# Patient Record
Sex: Female | Born: 1975 | Race: White | Hispanic: No | Marital: Single | Smoking: Current every day smoker
Health system: Southern US, Community
[De-identification: ages and names within clinical notes are randomized; demographics above are authoritative.]

## PROBLEM LIST (undated history)

## (undated) DIAGNOSIS — F329 Major depressive disorder, single episode, unspecified: Secondary | ICD-10-CM

## (undated) DIAGNOSIS — F419 Anxiety disorder, unspecified: Secondary | ICD-10-CM

## (undated) DIAGNOSIS — N6019 Diffuse cystic mastopathy of unspecified breast: Secondary | ICD-10-CM

## (undated) DIAGNOSIS — N809 Endometriosis, unspecified: Secondary | ICD-10-CM

## (undated) DIAGNOSIS — N83202 Unspecified ovarian cyst, left side: Secondary | ICD-10-CM

## (undated) DIAGNOSIS — C539 Malignant neoplasm of cervix uteri, unspecified: Secondary | ICD-10-CM

## (undated) DIAGNOSIS — F32A Depression, unspecified: Secondary | ICD-10-CM

## (undated) DIAGNOSIS — F4310 Post-traumatic stress disorder, unspecified: Secondary | ICD-10-CM

## (undated) DIAGNOSIS — M5136 Other intervertebral disc degeneration, lumbar region: Secondary | ICD-10-CM

## (undated) DIAGNOSIS — N83201 Unspecified ovarian cyst, right side: Secondary | ICD-10-CM

## (undated) HISTORY — PX: HERNIA REPAIR: SHX51

## (undated) HISTORY — DX: Other intervertebral disc degeneration, lumbar region: M51.36

## (undated) HISTORY — DX: Unspecified ovarian cyst, right side: N83.201

## (undated) HISTORY — DX: Anxiety disorder, unspecified: F41.9

## (undated) HISTORY — DX: Depression, unspecified: F32.A

## (undated) HISTORY — PX: KNEE ARTHROSCOPY: SUR90

## (undated) HISTORY — PX: ECTOPIC PREGNANCY SURGERY: SHX613

## (undated) HISTORY — PX: ABDOMINAL HYSTERECTOMY: SHX81

## (undated) HISTORY — DX: Endometriosis, unspecified: N80.9

## (undated) HISTORY — DX: Post-traumatic stress disorder, unspecified: F43.10

## (undated) HISTORY — DX: Malignant neoplasm of cervix uteri, unspecified: C53.9

## (undated) HISTORY — DX: Major depressive disorder, single episode, unspecified: F32.9

## (undated) HISTORY — DX: Diffuse cystic mastopathy of unspecified breast: N60.19

## (undated) HISTORY — DX: Unspecified ovarian cyst, right side: N83.202

---

## 2014-03-08 ENCOUNTER — Emergency Department: Payer: Self-pay | Admitting: Emergency Medicine

## 2014-03-08 LAB — COMPREHENSIVE METABOLIC PANEL
ALT: 28 U/L
Albumin: 3.2 g/dL — ABNORMAL LOW (ref 3.4–5.0)
Alkaline Phosphatase: 58 U/L
Anion Gap: 8 (ref 7–16)
BILIRUBIN TOTAL: 0.3 mg/dL (ref 0.2–1.0)
BUN: 15 mg/dL (ref 7–18)
CALCIUM: 7.9 mg/dL — AB (ref 8.5–10.1)
CHLORIDE: 104 mmol/L (ref 98–107)
CO2: 27 mmol/L (ref 21–32)
Creatinine: 1.05 mg/dL (ref 0.60–1.30)
EGFR (Non-African Amer.): >60
Glucose: 76 mg/dL (ref 65–99)
Osmolality: 277 (ref 275–301)
Potassium: 3.9 mmol/L (ref 3.5–5.1)
SGOT(AST): 21 U/L (ref 15–37)
SODIUM: 139 mmol/L (ref 136–145)
TOTAL PROTEIN: 6.6 g/dL (ref 6.4–8.2)

## 2014-03-08 LAB — URINALYSIS, COMPLETE
BILIRUBIN, UR: NEGATIVE
Bacteria: NONE SEEN
Glucose,UR: NEGATIVE mg/dL (ref 0–75)
Ketone: NEGATIVE
LEUKOCYTE ESTERASE: NEGATIVE
NITRITE: NEGATIVE
PROTEIN: NEGATIVE
Ph: 6 (ref 4.5–8.0)
RBC,UR: 1 /HPF (ref 0–5)
SPECIFIC GRAVITY: 1.011 (ref 1.003–1.030)
Squamous Epithelial: 1
WBC UR: 1 /HPF (ref 0–5)

## 2014-03-08 LAB — CBC
HCT: 36.9 % (ref 35.0–47.0)
HGB: 12.3 g/dL (ref 12.0–16.0)
MCH: 30.1 pg (ref 26.0–34.0)
MCHC: 33.3 g/dL (ref 32.0–36.0)
MCV: 91 fL (ref 80–100)
Platelet: 369 10*3/uL (ref 150–440)
RBC: 4.08 10*6/uL (ref 3.80–5.20)
RDW: 15.3 % — ABNORMAL HIGH (ref 11.5–14.5)
WBC: 8.6 10*3/uL (ref 3.6–11.0)

## 2014-03-08 LAB — PREGNANCY, URINE: Pregnancy Test, Urine: NEGATIVE m[IU]/mL

## 2014-03-08 LAB — PRO B NATRIURETIC PEPTIDE: B-TYPE NATIURETIC PEPTID: 142 pg/mL — AB (ref 0–125)

## 2014-03-09 LAB — TROPONIN I: Troponin-I: <0.02 ng/mL

## 2014-08-23 ENCOUNTER — Observation Stay: Payer: Self-pay | Admitting: Internal Medicine

## 2014-08-23 LAB — URINALYSIS, COMPLETE
Bacteria: NONE SEEN
Bilirubin,UR: NEGATIVE
Blood: NEGATIVE
Glucose,UR: NEGATIVE mg/dL (ref 0–75)
KETONE: NEGATIVE
LEUKOCYTE ESTERASE: NEGATIVE
Nitrite: NEGATIVE
PH: 7 (ref 4.5–8.0)
PROTEIN: NEGATIVE
SPECIFIC GRAVITY: 1.016 (ref 1.003–1.030)
Squamous Epithelial: 2

## 2014-08-23 LAB — CBC
HCT: 42.2 % (ref 35.0–47.0)
HGB: 13.9 g/dL (ref 12.0–16.0)
MCH: 30.4 pg (ref 26.0–34.0)
MCHC: 33 g/dL (ref 32.0–36.0)
MCV: 92 fL (ref 80–100)
PLATELETS: 309 10*3/uL (ref 150–440)
RBC: 4.58 10*6/uL (ref 3.80–5.20)
RDW: 14.6 % — AB (ref 11.5–14.5)
WBC: 9.7 10*3/uL (ref 3.6–11.0)

## 2014-08-23 LAB — DRUG SCREEN, URINE

## 2014-08-23 LAB — COMPREHENSIVE METABOLIC PANEL
ALT: 23 U/L
AST: 24 U/L (ref 15–37)
Albumin: 3.6 g/dL (ref 3.4–5.0)
Alkaline Phosphatase: 70 U/L
Anion Gap: 7 (ref 7–16)
BUN: 21 mg/dL — AB (ref 7–18)
Bilirubin,Total: 0.4 mg/dL (ref 0.2–1.0)
Calcium, Total: 8.9 mg/dL (ref 8.5–10.1)
Chloride: 106 mmol/L (ref 98–107)
Co2: 26 mmol/L (ref 21–32)
Creatinine: 0.91 mg/dL (ref 0.60–1.30)
EGFR (African American): >60
Glucose: 105 mg/dL — ABNORMAL HIGH (ref 65–99)
Osmolality: 281 (ref 275–301)
Potassium: 4.3 mmol/L (ref 3.5–5.1)
Sodium: 139 mmol/L (ref 136–145)
TOTAL PROTEIN: 7.5 g/dL (ref 6.4–8.2)

## 2014-08-23 LAB — LIPASE, BLOOD: Lipase: 116 U/L (ref 73–393)

## 2014-09-04 ENCOUNTER — Emergency Department: Payer: Self-pay | Admitting: Emergency Medicine

## 2014-09-04 LAB — COMPREHENSIVE METABOLIC PANEL
ALBUMIN: 3.5 g/dL (ref 3.4–5.0)
ALK PHOS: 68 U/L (ref 46–116)
Anion Gap: 9 (ref 7–16)
BILIRUBIN TOTAL: 0.2 mg/dL (ref 0.2–1.0)
BUN: 16 mg/dL (ref 7–18)
CREATININE: 1.66 mg/dL — AB (ref 0.60–1.30)
Calcium, Total: 8.4 mg/dL — ABNORMAL LOW (ref 8.5–10.1)
Chloride: 112 mmol/L — ABNORMAL HIGH (ref 98–107)
Co2: 25 mmol/L (ref 21–32)
EGFR (African American): 45 — ABNORMAL LOW
GFR CALC NON AF AMER: 37 — AB
Glucose: 105 mg/dL — ABNORMAL HIGH (ref 65–99)
OSMOLALITY: 292 (ref 275–301)
Potassium: 4.1 mmol/L (ref 3.5–5.1)
SGOT(AST): 19 U/L (ref 15–37)
SGPT (ALT): 20 U/L (ref 14–63)
SODIUM: 146 mmol/L — AB (ref 136–145)
Total Protein: 6.8 g/dL (ref 6.4–8.2)

## 2014-09-04 LAB — CBC
HCT: 39.5 % (ref 35.0–47.0)
HGB: 13.2 g/dL (ref 12.0–16.0)
MCH: 30.1 pg (ref 26.0–34.0)
MCHC: 33.5 g/dL (ref 32.0–36.0)
MCV: 90 fL (ref 80–100)
Platelet: 387 10*3/uL (ref 150–440)
RBC: 4.39 10*6/uL (ref 3.80–5.20)
RDW: 13.8 % (ref 11.5–14.5)
WBC: 8.3 10*3/uL (ref 3.6–11.0)

## 2014-09-04 LAB — URINALYSIS, COMPLETE
BILIRUBIN, UR: NEGATIVE
Bacteria: NONE SEEN
Glucose,UR: NEGATIVE mg/dL (ref 0–75)
Ketone: NEGATIVE
LEUKOCYTE ESTERASE: NEGATIVE
Nitrite: NEGATIVE
Ph: 5 (ref 4.5–8.0)
Protein: NEGATIVE
Specific Gravity: 1.014 (ref 1.003–1.030)
Squamous Epithelial: 1

## 2014-09-04 LAB — LIPASE, BLOOD: Lipase: 157 U/L (ref 73–393)

## 2014-09-04 LAB — TROPONIN I

## 2014-09-04 LAB — ETHANOL: Ethanol: 243 mg/dL

## 2014-09-16 ENCOUNTER — Ambulatory Visit: Payer: Self-pay | Admitting: Gastroenterology

## 2014-10-03 ENCOUNTER — Ambulatory Visit: Payer: Self-pay | Admitting: Gastroenterology

## 2014-12-08 NOTE — Discharge Summary (Signed)
PATIENT NAME:  Marcia Tucker, Marcia Tucker MR#:  [id number] DATE OF BIRTH:  07-Jun-1976  DATE OF ADMISSION:  08/23/2014 DATE OF DISCHARGE:  08/24/2014  For a detailed note, please take a look at the history and physical done on admission by Dr. Darvin Neighbours.   DIAGNOSES AT DISCHARGE: As follows:  1.  Right upper quadrant abdominal pain.  2.  Posttraumatic stress disorder.  3.  History of obsessive-compulsive disorder.  4.  Depression.   DIET: The patient is being discharged on a regular diet.   ACTIVITY: As tolerated.   FOLLOWUP: With gastroenterology. She has been given referral in the next 1-2 weeks.   DISCHARGE MEDICATIONS: Gabapentin 300 mg 1 tablet q.i.d., fluoxetine 40 mg 2 capsules daily, bupropion 100 mg b.i.d., benztropine 1 mg b.i.d., prazosin 2 mg at bedtime, trazodone 150 mg daily, Risperdal 1 mg b.i.d.   PERTINENT STUDIES DONE DURING THE HOSPITAL COURSE: As follows: A CT scan of the abdomen and pelvis done with contrast showing no acute intra-abdominal or pelvic process. A 4.3 cm multicystic left ovary and lesion, indeterminate, most likely benign. Follow up ultrasound in 6 weeks is recommended. An ultrasound of the abdomen, limited, showing no evidence of any acute right upper quadrant free fluid or any abnormality. A hepatobiliary scan done showing normal gallbladder ejection fraction, normal study.   HOSPITAL COURSE: This is a 39 year old female who presented to the hospital with right upper quadrant abdominal pain.  1.  Right upper quadrant abdominal pain. The exact etiology of this is unclear, but the patient likely has chronic abdominal pain as she has been having these symptoms for over 2 years, but they had gotten worse prior to admission. The patient underwent a workup including a CT scan, a HIDA scan, and also abdominal ultrasound which showed no acute pathology. Her LFTs remained stable. Her diet was slowly advanced from a clear liquid eventually to a regular diet, which she is currently  tolerating. At this point, she is being referred to gastroenterology as an outpatient for further management and evaluation of her chronic abdominal pain. There was no evidence of an acute hepatobiliary pathology based on the imaging studies as mentioned.  2.  PTSD and obsessive-compulsive disorder. The patient will continue her medications, including Wellbutrin, fluoxetine, trazodone, and Risperdal as stated.  3.  Depression. The patient will continue her Prozac as stated above.   CODE STATUS: The patient is a FULL CODE.   DISPOSITION: She was discharged home.   TIME SPENT: 35 minutes.    ____________________________ Belia Heman. Verdell Carmine, MD vjs:TT D: 08/25/2014 08:28:00 ET T: 08/25/2014 17:50:07 ET JOB#: [id number]  cc: Belia Heman. Verdell Carmine, MD, <Dictator> Henreitta Leber MD ELECTRONICALLY SIGNED 09/03/2014 11:51

## 2014-12-08 NOTE — H&P (Signed)
PATIENT NAME:  Marcia Tucker, Marcia Tucker MR#:  [id number] DATE OF BIRTH:  20-Mar-1976  DATE OF ADMISSION:  08/23/2014  PRIMARY CARE PHYSICIAN: None.   CHIEF COMPLAINT: Right upper quadrant pain.   HISTORY OF PRESENTING ILLNESS: A 39 year old Caucasian female patient with a history of PTSD, OCD, who has had right upper quadrant pain on and off for 2 years, presents to the Emergency Room with worsening pain. The patient mentions that this pain started about 2 years back with sharp pain which lasts a few seconds which would resolve. Not related to food, but over the past 10 days the patient has had worsening pain lasting 20 minutes, 30 minutes, and at this time this is completely unresolving on the right upper quadrant area. This is not related to food, position. No aggravating or relieving factors. The patient's lipase is normal. Right upper quadrant ultrasound and CT scan of the abdomen have been normal, except a benign cyst on the left ovary. The patient is being admitted for intractable pain in her abdomen. LFTs are normal.   The patient mentioned that she has been taking about 6 to 8 ibuprofen over the last week for this pain, with no decrease in pain.   PAST MEDICAL HISTORY:  1.  PTSD.  2.  OCD.  3.  Depression.  4.  Partial hysterectomy.   FAMILY HISTORY: Gallstones in her sister, hernia repair in her mom.   SOCIAL HISTORY: The patient does not smoke anymore; she quit 15 days back. Occasional alcohol use once or twice a month. No illicit drug use. She is unemployed. Has 4 kids at home.   REVIEW OF SYSTEMS:  CONSTITUTIONAL: Complains of some fatigue. EYES: No blurry vision, pain, or redness. ENT:  No tinnitus, ear pain, hearing loss.  RESPIRATORY: No cough, wheeze, hemoptysis.  CARDIOVASCULAR: No chest pain, orthopnea, edema.  GASTROINTESTINAL: Has nausea, abdominal pain.  GENITOURINARY: No dysuria, hematuria, or frequency.   ENDOCRINE: No polyuria, nocturia, thyroid problems. HEMATOLOGIC AND  LYMPHATIC: No anemia, easy bruising, bleeding.  INTEGUMENTARY: No acne, rash, lesion.  MUSCULOSKELETAL: No back pain or arthritis.  NEUROLOGIC: No focal numbness, weakness.  PSYCHIATRIC: Has depression, PTSD, OCD.   ALLERGIES: LATEX.   HOME MEDICATIONS:  1.  Benztropine 1 mg oral 2 times a day.  2.  Bupropion 100 mg oral 2 times a day.  3.  Fluoxetine 40 mg 2 capsules once a day. 4.  Gabapentin 300 mg oral 4 times a day.  5.  Prazosin 2 mg oral once a day.  6.  Risperidone 1 mg oral 2 times a day.  7.  Tylenol 650mg  oral once a day.   PHYSICAL EXAMINATION: Shows:  VITAL SIGNS: Temperature 98.4, pulse 96, blood pressure 114/70, saturating 98% on room air.  GENERAL: Obese Caucasian female patient lying in bed, in distress secondary to right upper quadrant pain, holding her abdomen.  PSYCHIATRIC: Alert and oriented x 3, anxious. HEENT: Atraumatic, normocephalic. Oral mucosa moist and pink. Extraocular movements normal. No pallor. No icterus. Pupils bilaterally equal and reactive to light.  NECK: Supple. No thyromegaly. No palpable lymph nodes. Trachea midline. No carotid bruit or JVD.  CARDIOVASCULAR: S1, S2, without any murmurs. Peripheral pulses 2+. No edema.  RESPIRATORY: Normal work of breathing. Clear to auscultation both sides. GASTROINTESTINAL: Soft abdomen. Tenderness in the right upper quadrant area. Murphy sign negative. No hepatosplenomegaly palpable. Bowel sounds normal.  GENITOURINARY: No CVA tenderness or bladder distention.  SKIN: Warm and dry. No petechiae, rash, ulcers.  MUSCULOSKELETAL: No joint  swelling, redness, effusion of the large joints. Normal muscle tone.  NEUROLOGICAL: Motor strength 5/5 in upper extremities. Sensation and range intact all over.  LYMPHATIC: No cervical lymphadenopathy.   LABORATORY STUDIES: Glucose 105, BUN 21, creatinine 0.91, , potassium 4.3. AST, ALT, alkaline phosphatase, bilirubin normal. WBC 9.7, hemoglobin 13.9, platelets 309,000.  Urinalysis shows no bacteria.  Right upper quadrant ultrasound showed no gallstones, no cholecystitis.   CT scan of the abdomen and pelvis with contrast showed normal findings. A 4.3 cm multicystic left ovarian lesion. Needs 35-month outpatient followup.   ASSESSMENT AND PLAN: 1.  Right upper quadrant pain uncontrolled with multiple intravenous pain medications in the hospital. Will admit the patient for pain control, put her on a liquid diet, get a HIDA scan tomorrow morning. Will consult surgery if this is positive. If this is negative, patient will need a gastroenterology consult. Labs are normal. She does not seem to have any life-threatening condition at this point, but has intractable pain.  2.  Obsessive-compulsive disorder, depression, posttraumatic stress disorder. Continue home medications.  3.  Deep vein thrombosis prophylaxis with sequential compression devices.   CODE STATUS: Full code.   TIME SPENT TODAY ON THIS CASE: 40 minutes.   ____________________________ Leia Alf Lether Tesch, MD srs:ST D: 08/23/2014 21:36:58 ET T: 08/23/2014 23:12:12 ET JOB#: [id number]  cc: Alveta Heimlich R. Nalea Salce, MD, <Dictator> Neita Carp MD ELECTRONICALLY SIGNED 08/25/2014 18:46

## 2015-02-05 ENCOUNTER — Ambulatory Visit: Payer: Self-pay | Admitting: Obstetrics and Gynecology

## 2015-02-25 ENCOUNTER — Encounter: Payer: Self-pay | Admitting: Obstetrics and Gynecology

## 2015-02-25 ENCOUNTER — Ambulatory Visit (INDEPENDENT_AMBULATORY_CARE_PROVIDER_SITE_OTHER): Payer: Medicaid Other | Admitting: Obstetrics and Gynecology

## 2015-02-25 VITALS — BP 143/90 | HR 99 | Ht 63.0 in | Wt 160.7 lb

## 2015-02-25 DIAGNOSIS — N809 Endometriosis, unspecified: Secondary | ICD-10-CM

## 2015-02-25 DIAGNOSIS — R102 Pelvic and perineal pain: Secondary | ICD-10-CM

## 2015-02-25 MED ORDER — DANAZOL 200 MG PO CAPS: 400.0000 mg | ORAL_CAPSULE | Freq: Two times a day (BID) | ORAL | Status: DC

## 2015-02-25 MED ORDER — OXYCODONE-ACETAMINOPHEN 5-325 MG PO TABS: 1.0000 | ORAL_TABLET | Freq: Four times a day (QID) | ORAL | Status: DC | PRN

## 2015-02-25 MED ORDER — ACYCLOVIR 400 MG PO TABS: 400.0000 mg | ORAL_TABLET | Freq: Three times a day (TID) | ORAL | Status: AC

## 2015-02-25 MED ORDER — LEUPROLIDE ACETATE 3.75 MG IM KIT: 3.7500 mg | PACK | Freq: Once | INTRAMUSCULAR | Status: AC

## 2015-02-26 DIAGNOSIS — R102 Pelvic and perineal pain: Secondary | ICD-10-CM | POA: Insufficient documentation

## 2015-02-26 NOTE — Progress Notes (Signed)
GYNECOLOGY PROGRESS NOTE  Subjective:    Patient ID: Marcia Tucker, female    DOB: 10/15/1975, 39 y.o.   MRN: [id number]  HPI  Patient is a 39 y.o. G32P1011 female who presents for follow up of endometriosis and pelvic pain. Is s/p hysterectomy several years ago. Patient was placed on combined continuous OCP trial for 3 months last visit.  Reports no improvement in symptoms, and that actually symptoms have worsened.  Was previously using 1 tab of T#3 with good relief of pain, however in the past month, pain has worsened to the point where 2 tabs still offer no relief.    The following portions of the patient's history were reviewed and updated as appropriate: allergies, current medications, past family history, past medical history, past social history, past surgical history and problem list.  Review of Systems Pertinent items are noted in HPI.   Objective:   Blood pressure 143/90, pulse 99, height 5\' 3"  (1.6 m), weight 160 lb 11.2 oz (72.893 kg). General appearance: alert and mild distress Abdomen: normal findings: bowel sounds normal, no masses palpable and soft and abnormal findings:  moderate tenderness in the lower abdomen Pelvic: deferred and due to patient discomfort, declined exam Extremities: extremities normal, atraumatic, no cyanosis or edema Neurologic: Grossly normal   Assessment:   H/o endometriosis Chronic pelvic pain  Plan:   Discussion had previously on different management options for pain likely secondary to endometriosis, including Depo Provera, Lupron, Danazol.  Has failed management with continuous OCPs.  Patient desires trial of Lupron.  Will order. Patient was initially hesitant about Danazol therapy however offered this in interim as it may take some time to receive Lupron based on patient's issues with current insurance).  Patient willing to take for short term use.  Will prescribe.   Will change pain meds from T#3 to Percocet (patient reports side effects from  Vicodin use in the past).  RTC once Lupron medication has arrived to clinic. Will notify patient of arrival.  Rubie Maid, MD [facility]

## 2015-03-04 ENCOUNTER — Telehealth: Payer: Self-pay

## 2015-03-04 NOTE — Telephone Encounter (Signed)
Lupron called and states that the medication will be delivered to our office on 03/06/15 (this Thursday), please tell me how you want patient to be scheduled and when she needs to follow up after injection so I can have her scheduled. Thanks

## 2015-03-05 NOTE — Telephone Encounter (Signed)
Please call this patient and have her scheduled for a nurse visit for a Lupron injection. Pt should be scheduled sometime next week, or the week after. Thanks

## 2015-03-05 NOTE — Telephone Encounter (Signed)
Patient can be scheduled for nurse visit once Lupron comes in.  Needs to f/u in 3-4 weeks after injection to discuss efficacy of medication and reorder if needed.

## 2015-03-11 ENCOUNTER — Telehealth: Payer: Self-pay

## 2015-03-11 NOTE — Telephone Encounter (Signed)
Can we please call this pt and have her scheduled for a Nurse visit for a Lupron injection. Thanks

## 2015-03-12 NOTE — Telephone Encounter (Signed)
DONE, PT IS COMING IN ON 03/17/15 AT 11

## 2015-03-13 NOTE — Telephone Encounter (Signed)
Appointment scheduled.

## 2015-03-17 ENCOUNTER — Ambulatory Visit: Payer: Medicaid Other

## 2015-03-17 ENCOUNTER — Telehealth: Payer: Self-pay | Admitting: Obstetrics and Gynecology

## 2015-03-17 NOTE — Telephone Encounter (Signed)
Pt has [place] access and is in the process of changing the primary care on her card so she can come here. She lives in [place] and is out of her oxycodone. She wants a new script until she can get her card changed. It take 30 days to change and its been 2 wks. She also said she was nauseated and wants something for that.

## 2015-03-17 NOTE — Telephone Encounter (Signed)
Left message to contact office. Pt had canceled her appt due to insurance and wants rx for oxycodone. She will need to come to office and pick this up.

## 2015-03-18 ENCOUNTER — Telehealth: Payer: Self-pay | Admitting: Obstetrics and Gynecology

## 2015-03-18 MED ORDER — ONDANSETRON 4 MG PO TBDP: 4.0000 mg | ORAL_TABLET | Freq: Three times a day (TID) | ORAL | Status: AC | PRN

## 2015-03-18 MED ORDER — OXYCODONE-ACETAMINOPHEN 5-325 MG PO TABS
1.0000 | ORAL_TABLET | Freq: Four times a day (QID) | ORAL | Status: DC | PRN
Start: 2015-03-18 — End: 2015-04-17

## 2015-03-18 MED ORDER — ALBUTEROL SULFATE HFA 108 (90 BASE) MCG/ACT IN AERS: 2.0000 | INHALATION_SPRAY | Freq: Four times a day (QID) | RESPIRATORY_TRACT | Status: AC | PRN

## 2015-03-18 NOTE — Telephone Encounter (Signed)
Done. See previous message.

## 2015-03-18 NOTE — Telephone Encounter (Signed)
Pt returns call today and would like her pain medication, something for nausea, and if possible her pro-air inhaler (she got this last time in the ER in [place]). Dr. Lina Sayre refills. She will need to see her PCP for any further refills of inhaler when her insurance gets straightened out. Pt will come by and pick up RX's.

## 2015-03-18 NOTE — Telephone Encounter (Signed)
Pt called and wanted to know if Dr Marcelline Mates could write her a RX for pain meds due to her not being able to come in and be seen due to her insurance.

## 2015-04-16 ENCOUNTER — Telehealth: Payer: Self-pay | Admitting: Obstetrics and Gynecology

## 2015-04-16 NOTE — Telephone Encounter (Signed)
Oxycodone and danalin needs to be refilled. This pt is waiting on medicaid to get fixed but is out of pain meds.

## 2015-04-17 MED ORDER — DANAZOL 200 MG PO CAPS: 400.0000 mg | ORAL_CAPSULE | Freq: Two times a day (BID) | ORAL | Status: AC

## 2015-04-17 MED ORDER — OXYCODONE-ACETAMINOPHEN 5-325 MG PO TABS: 1.0000 | ORAL_TABLET | Freq: Four times a day (QID) | ORAL | Status: AC | PRN

## 2015-04-17 NOTE — Telephone Encounter (Signed)
I will refill her medications, but of course she will need to come in and pick them up.  Please remind [place] to keep her updated about her insurance status so that she can be scheduled for her next Lupron injection once it is fixed.

## 2015-04-17 NOTE — Telephone Encounter (Signed)
i told her and she will come pick up

## 2015-04-17 NOTE — Telephone Encounter (Signed)
PT CALLED YESTERDAY AND WANTED TO KNOW IF SHE COULD GET A REFILL ON HER MEDS, SHE IS COMPLETELY OUT, SHE IS CALLING IN AGAIN ABOUT THE MED REFILL/

## 2015-06-13 ENCOUNTER — Telehealth: Payer: Self-pay | Admitting: Obstetrics and Gynecology

## 2015-06-13 NOTE — Telephone Encounter (Signed)
Ok. Thank you.

## 2015-06-13 NOTE — Telephone Encounter (Signed)
[facility] CALLED AND THEY ARE DISCHARGING THIS PT FROM THE PHARMACY.THEY HAVE BEEN UNABLE TO CONTACT THE PT.

## 2016-08-24 IMAGING — US ABDOMEN ULTRASOUND LIMITED
1 series · 14 of 25 positions shown · non-contrast
Comparison: None

CLINICAL DATA: RIGHT upper quadrant pain for a few years worse in
last month, no nausea or vomiting

EXAM:
US ABDOMEN LIMITED - RIGHT UPPER QUADRANT

[Series 1: abdomen ultrasound limited · 0.24mm/px · 14 of 43 slices shown]
[im 1/43]
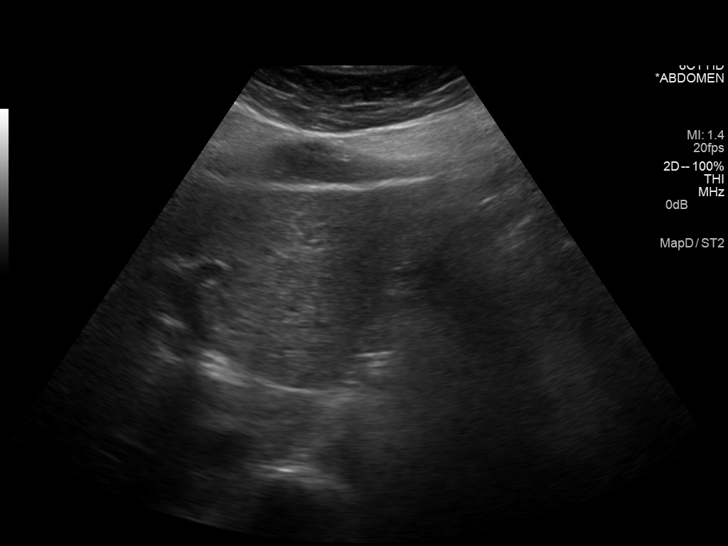
[im 4/43]
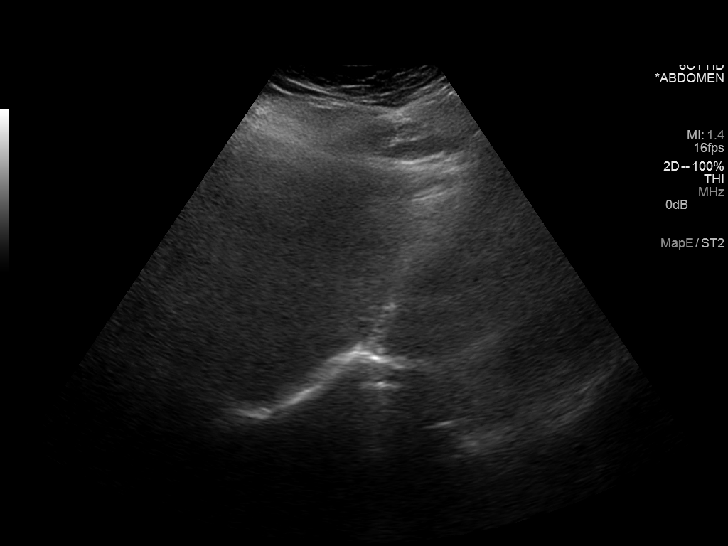
[im 8/43]
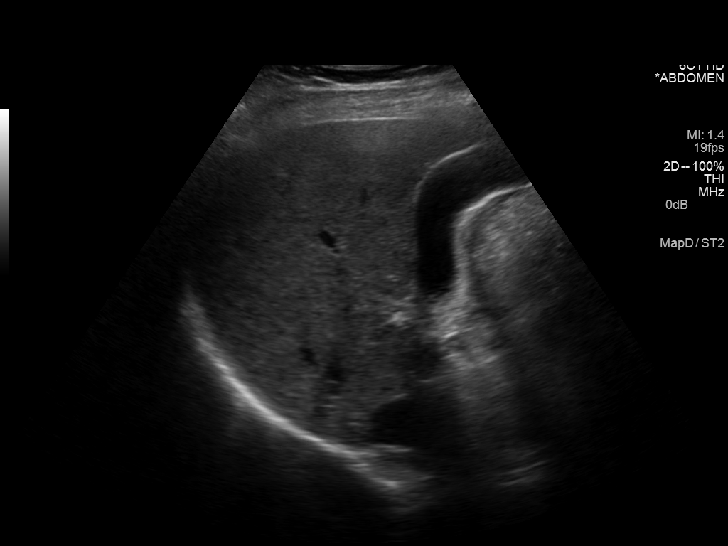
[im 11/43]
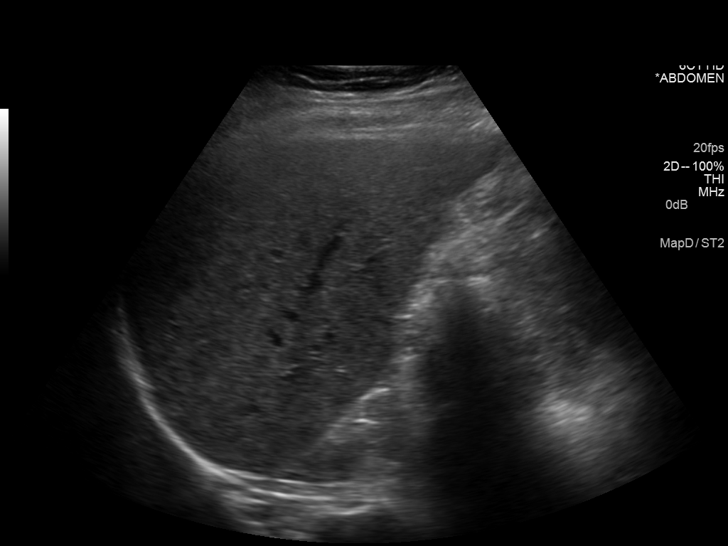
[im 15/43]
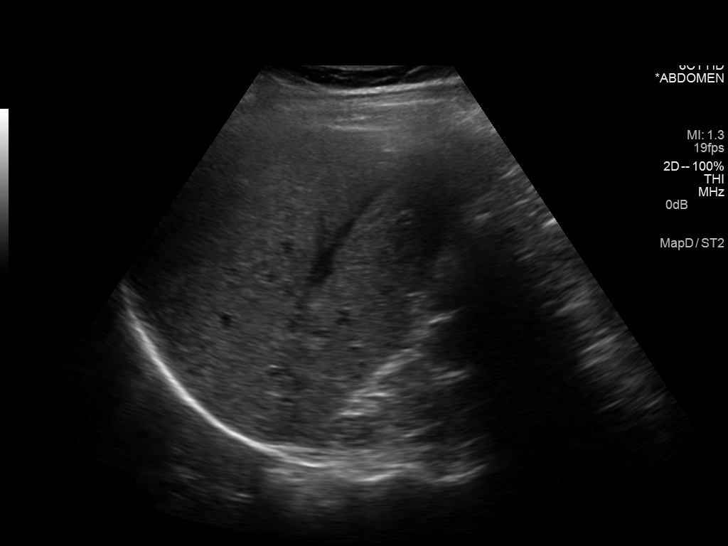
[im 16/43]
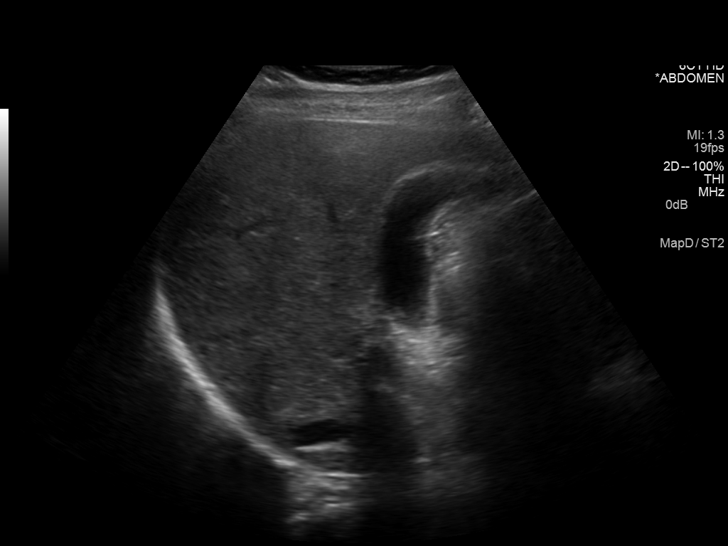
[im 20/43]
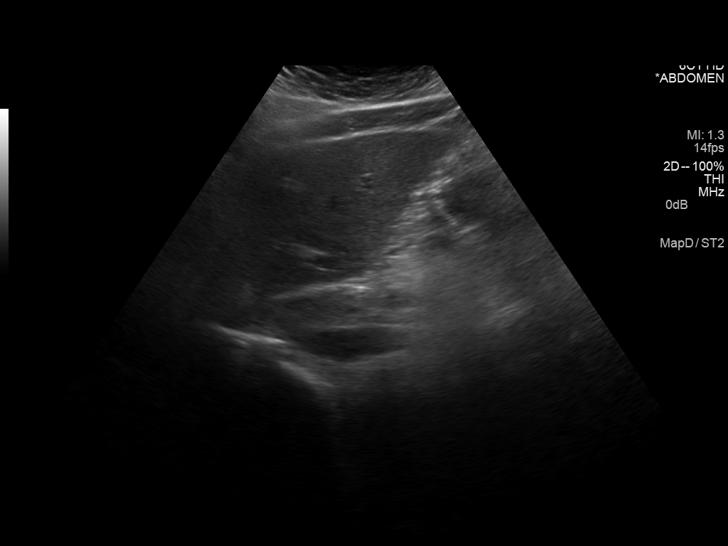
[im 23/43]
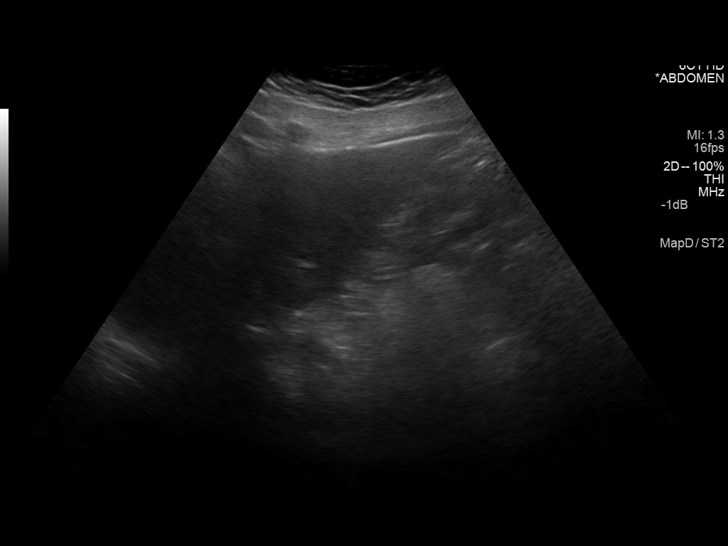
[im 27/43]
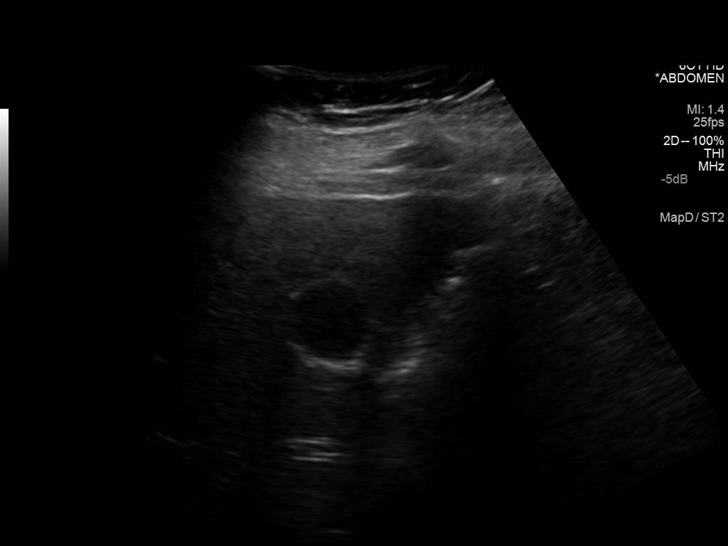
[im 29/43]
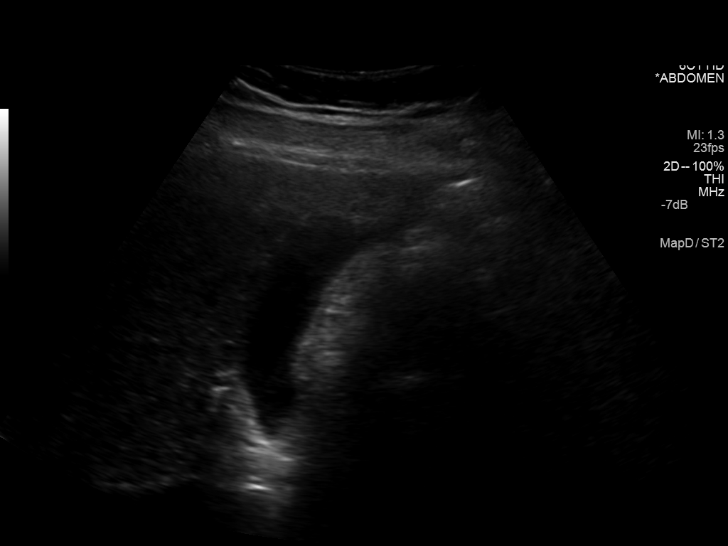
[im 32/43]
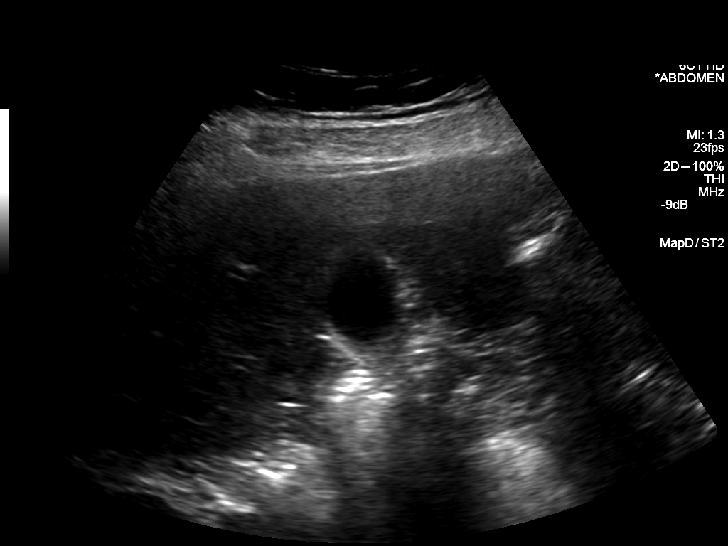
[im 36/43]
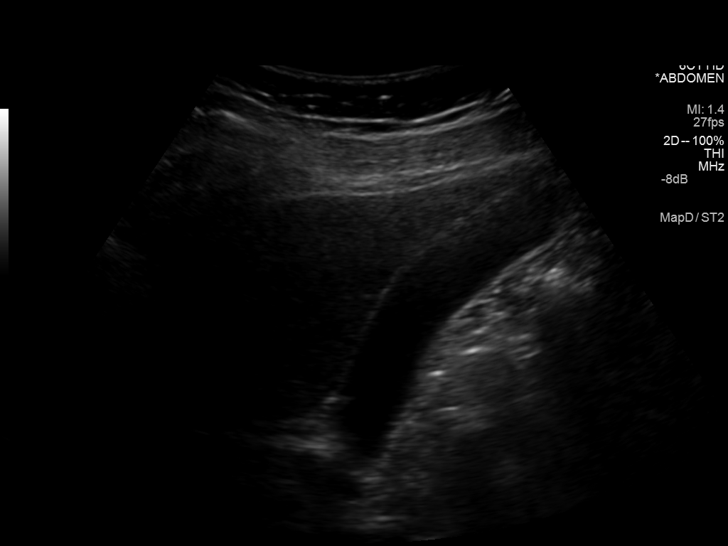
[im 39/43]
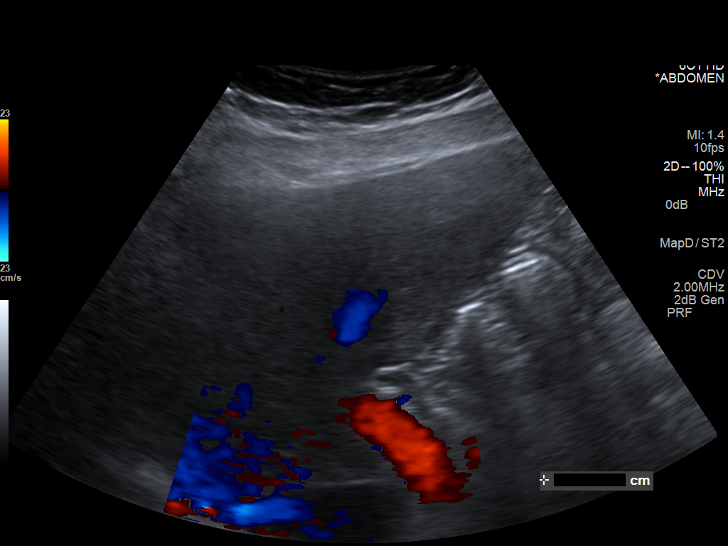
[im 43/43]
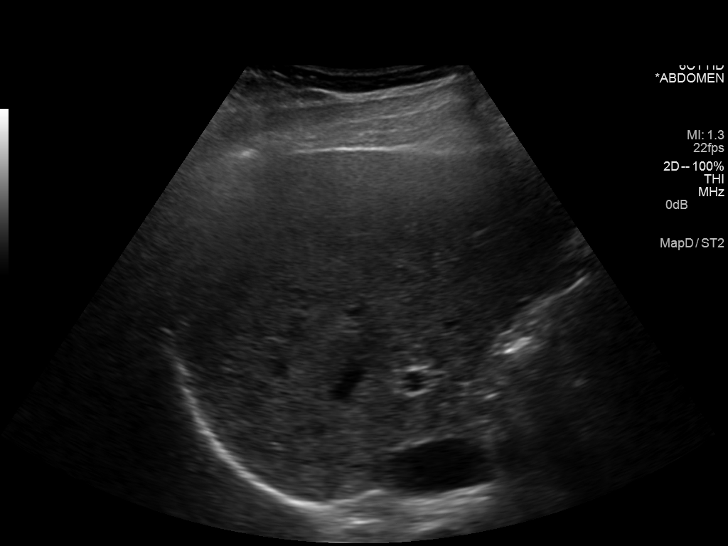

[14 of 25 positions shown; findings below may reference images not displayed]

FINDINGS: Gallbladder:

Normally distended without stones or wall thickening.

No pericholecystic fluid.

Unable assess for presence of a sonographic Murphy sign, patient
medicated.

Common bile duct:

Diameter: Normal caliber 4 mm diameter

Liver:

Normal appearance

No RIGHT upper quadrant free fluid.
IMPRESSION: Normal exam.

## 2016-08-25 IMAGING — NM NUCLEAR MEDICINE HEPATOHBILIARY INCLUDE GB
2 series · 15 of 15 positions shown · non-contrast
Comparison: 08/23/2014 CT and ultrasound

CLINICAL DATA: Right upper quadrant pain

EXAM:
NUCLEAR MEDICINE HEPATOBILIARY IMAGING WITH GALLBLADDER EF
TECHNIQUE: Sequential images of the abdomen were obtained [DATE] minutes
following intravenous administration of radiopharmaceutical. After
slow intravenous infusion of 1.54 micrograms Cholecystokinin,
gallbladder ejection fraction was determined.
RADIOPHARMACEUTICALS:  8.55 Millicurie Cc-UUm Choletec

[Series 1000: gallbladder statics · 4.80mm/px · 9 of 9 slices shown]
[im 1/9]
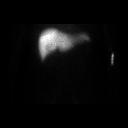
[im 2/9]
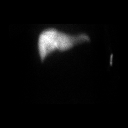
[im 3/9]
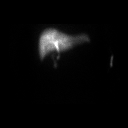
[im 4/9]
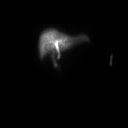
[im 5/9]
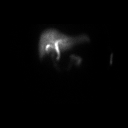
[im 6/9]
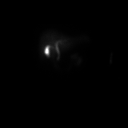
[im 7/9]
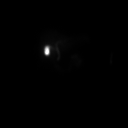
[im 8/9]
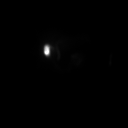
[im 9/9]
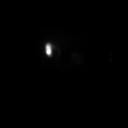

[Series 1000: gallbladder ef · 4.80mm/px · 6 of 120 frames shown]
[frame 11/120]
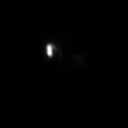
[frame 31/120]
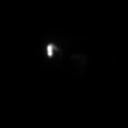
[frame 51/120]
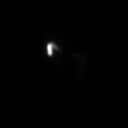
[frame 71/120]
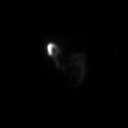
[frame 91/120]
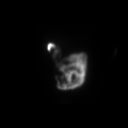
[frame 111/120]
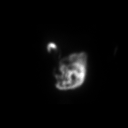

[15 of 15 positions shown; findings below may reference images not displayed]

FINDINGS: There is prompt uptake and excretion of radiotracer by the liver. No
evidence of cystic duct or common bile duct obstruction. The
gallbladder ejection fraction is 92%. At 30 min, normal ejection
fraction is greater than 30%.

The patient did not experience symptoms during CCK infusion.
IMPRESSION: Normal gallbladder ejection fraction.  Normal study.
